# Patient Record
Sex: Female | Born: 1970 | Race: White | Hispanic: No | Marital: Married | Smoking: Never smoker
Health system: Southern US, Community
[De-identification: ages and names within clinical notes are randomized; demographics above are authoritative.]

## PROBLEM LIST (undated history)

## (undated) DIAGNOSIS — F419 Anxiety disorder, unspecified: Secondary | ICD-10-CM

## (undated) DIAGNOSIS — F32A Depression, unspecified: Secondary | ICD-10-CM

## (undated) DIAGNOSIS — I10 Essential (primary) hypertension: Secondary | ICD-10-CM

## (undated) DIAGNOSIS — F329 Major depressive disorder, single episode, unspecified: Secondary | ICD-10-CM

## (undated) DIAGNOSIS — E119 Type 2 diabetes mellitus without complications: Secondary | ICD-10-CM

## (undated) DIAGNOSIS — T7840XA Allergy, unspecified, initial encounter: Secondary | ICD-10-CM

## (undated) HISTORY — DX: Anxiety disorder, unspecified: F41.9

## (undated) HISTORY — DX: Depression, unspecified: F32.A

## (undated) HISTORY — DX: Allergy, unspecified, initial encounter: T78.40XA

## (undated) HISTORY — DX: Type 2 diabetes mellitus without complications: E11.9

## (undated) HISTORY — DX: Essential (primary) hypertension: I10

## (undated) HISTORY — DX: Major depressive disorder, single episode, unspecified: F32.9

---

## 2011-02-19 HISTORY — PX: HYSTEROSCOPY: SHX211

## 2015-03-07 ENCOUNTER — Ambulatory Visit: Payer: Self-pay | Admitting: Internal Medicine

## 2015-03-08 ENCOUNTER — Ambulatory Visit: Payer: Self-pay | Admitting: Family Medicine

## 2015-03-10 ENCOUNTER — Encounter: Payer: Self-pay | Admitting: Family Medicine

## 2015-03-10 ENCOUNTER — Ambulatory Visit (INDEPENDENT_AMBULATORY_CARE_PROVIDER_SITE_OTHER): Payer: 59 | Admitting: Family Medicine

## 2015-03-10 VITALS — BP 121/73 | HR 98 | Temp 97.9°F | Resp 20 | Ht 69.0 in | Wt 378.8 lb

## 2015-03-10 DIAGNOSIS — Z0000 Encounter for general adult medical examination without abnormal findings: Secondary | ICD-10-CM

## 2015-03-10 DIAGNOSIS — J302 Other seasonal allergic rhinitis: Secondary | ICD-10-CM

## 2015-03-10 DIAGNOSIS — M5386 Other specified dorsopathies, lumbar region: Secondary | ICD-10-CM | POA: Insufficient documentation

## 2015-03-10 DIAGNOSIS — E785 Hyperlipidemia, unspecified: Secondary | ICD-10-CM

## 2015-03-10 DIAGNOSIS — Z7189 Other specified counseling: Secondary | ICD-10-CM

## 2015-03-10 DIAGNOSIS — Z7689 Persons encountering health services in other specified circumstances: Secondary | ICD-10-CM | POA: Insufficient documentation

## 2015-03-10 DIAGNOSIS — M5431 Sciatica, right side: Secondary | ICD-10-CM | POA: Diagnosis not present

## 2015-03-10 DIAGNOSIS — F418 Other specified anxiety disorders: Secondary | ICD-10-CM | POA: Diagnosis not present

## 2015-03-10 DIAGNOSIS — I10 Essential (primary) hypertension: Secondary | ICD-10-CM | POA: Diagnosis not present

## 2015-03-10 DIAGNOSIS — E119 Type 2 diabetes mellitus without complications: Secondary | ICD-10-CM | POA: Insufficient documentation

## 2015-03-10 MED ORDER — CYCLOBENZAPRINE HCL 10 MG PO TABS: ORAL_TABLET | ORAL | Status: DC

## 2015-03-10 MED ORDER — MONTELUKAST SODIUM 10 MG PO TABS: ORAL_TABLET | ORAL | Status: DC

## 2015-03-10 MED ORDER — BENICAR HCT 40-25 MG PO TABS: 1.0000 | ORAL_TABLET | Freq: Every day | ORAL | Status: DC

## 2015-03-10 MED ORDER — GABAPENTIN 600 MG PO TABS: ORAL_TABLET | ORAL | Status: DC

## 2015-03-10 MED ORDER — ONGLYZA 5 MG PO TABS: 5.0000 mg | ORAL_TABLET | Freq: Every day | ORAL | Status: DC

## 2015-03-10 MED ORDER — AMLODIPINE BESYLATE 10 MG PO TABS: 10.0000 mg | ORAL_TABLET | Freq: Every day | ORAL | Status: DC

## 2015-03-10 MED ORDER — ATORVASTATIN CALCIUM 40 MG PO TABS: 40.0000 mg | ORAL_TABLET | Freq: Every day | ORAL | Status: DC

## 2015-03-10 MED ORDER — SERTRALINE HCL 100 MG PO TABS: 100.0000 mg | ORAL_TABLET | Freq: Every day | ORAL | Status: DC

## 2015-03-10 MED ORDER — BUSPIRONE HCL 15 MG PO TABS: 15.0000 mg | ORAL_TABLET | Freq: Every day | ORAL | Status: DC

## 2015-03-10 MED ORDER — ATENOLOL 50 MG PO TABS: 50.0000 mg | ORAL_TABLET | Freq: Every day | ORAL | Status: DC

## 2015-03-10 NOTE — Patient Instructions (Signed)
Sciatica Sciatica is pain, weakness, numbness, or tingling along the path of the sciatic nerve. The nerve starts in the lower back and runs down the back of each leg. The nerve controls the muscles in the lower leg and in the back of the knee, while also providing sensation to the back of the thigh, lower leg, and the sole of your foot. Sciatica is a symptom of another medical condition. For instance, nerve damage or certain conditions, such as a herniated disk or bone spur on the spine, pinch or put pressure on the sciatic nerve. This causes the pain, weakness, or other sensations normally associated with sciatica. Generally, sciatica only affects one side of the body. CAUSES   Herniated or slipped disc.  Degenerative disk disease.  A pain disorder involving the narrow muscle in the buttocks (piriformis syndrome).  Pelvic injury or fracture.  Pregnancy.  Tumor (rare). SYMPTOMS  Symptoms can vary from mild to very severe. The symptoms usually travel from the low back to the buttocks and down the back of the leg. Symptoms can include:  Mild tingling or dull aches in the lower back, leg, or hip.  Numbness in the back of the calf or sole of the foot.  Burning sensations in the lower back, leg, or hip.  Sharp pains in the lower back, leg, or hip.  Leg weakness.  Severe back pain inhibiting movement. These symptoms may get worse with coughing, sneezing, laughing, or prolonged sitting or standing. Also, being overweight may worsen symptoms. DIAGNOSIS  Your caregiver will perform a physical exam to look for common symptoms of sciatica. He or she may ask you to do certain movements or activities that would trigger sciatic nerve pain. Other tests may be performed to find the cause of the sciatica. These may include:  Blood tests.  X-rays.  Imaging tests, such as an MRI or CT scan. TREATMENT  Treatment is directed at the cause of the sciatic pain. Sometimes, treatment is not necessary  and the pain and discomfort goes away on its own. If treatment is needed, your caregiver may suggest:  Over-the-counter medicines to relieve pain.  Prescription medicines, such as anti-inflammatory medicine, muscle relaxants, or narcotics.  Applying heat or ice to the painful area.  Steroid injections to lessen pain, irritation, and inflammation around the nerve.  Reducing activity during periods of pain.  Exercising and stretching to strengthen your abdomen and improve flexibility of your spine. Your caregiver may suggest losing weight if the extra weight makes the back pain worse.  Physical therapy.  Surgery to eliminate what is pressing or pinching the nerve, such as a bone spur or part of a herniated disk. HOME CARE INSTRUCTIONS   Only take over-the-counter or prescription medicines for pain or discomfort as directed by your caregiver.  Apply ice to the affected area for 20 minutes, 3-4 times a day for the first 48-72 hours. Then try heat in the same way.  Exercise, stretch, or perform your usual activities if these do not aggravate your pain.  Attend physical therapy sessions as directed by your caregiver.  Keep all follow-up appointments as directed by your caregiver.  Do not wear high heels or shoes that do not provide proper support.  Check your mattress to see if it is too soft. A firm mattress may lessen your pain and discomfort. SEEK IMMEDIATE MEDICAL CARE IF:   You lose control of your bowel or bladder (incontinence).  You have increasing weakness in the lower back, pelvis, buttocks,   or legs.  You have redness or swelling of your back.  You have a burning sensation when you urinate.  You have pain that gets worse when you lie down or awakens you at night.  Your pain is worse than you have experienced in the past.  Your pain is lasting longer than 4 weeks.  You are suddenly losing weight without reason. MAKE SURE YOU:  Understand these  instructions.  Will watch your condition.  Will get help right away if you are not doing well or get worse.   This information is not intended to replace advice given to you by your health care provider. Make sure you discuss any questions you have with your health care provider.   Document Released: 01/29/2001 Document Revised: 10/26/2014 Document Reviewed: 06/16/2011 Elsevier Interactive Patient Education 2016 [employer].   Take Aleve two times a day for back pain, with food. Increased gabapentin to 300 mg morning, afternoon, then 600 mg at night.  Start stretches using pain as your guide.  FU in 2 weeks, at that time we will consider PT referral

## 2015-03-10 NOTE — Progress Notes (Addendum)
Subjective:    Patient ID: Emily Keller, female    DOB: Apr 09, 1970, 45 y.o.   MRN: [id number]  HPI   Patient presents for new patient establishment with multiple co-morbid condition and complaints of chronic back pain . All past medical history, surgical history, allergies, family history, immunizations and social history was obtained from the patient today and entered into the electronic medical record. Records are requested from her prior PCP, and will be reviewed at the time they are received. All medical records will be updated at that time.  Moved from [place] in July 2016, new to establish in the area. Pt has not found a dental home.   Diabetes, type 2: Patient reports she is a "borderline "diabetic, that was diagnosed about one year ago. She states she was started on metformin, and one is unable to tolerate it due to GI symptoms. She reports she then was prescribed Onglyza and has been on it since. Unknown last A1c, patient states she believes it was 6.1 and was collected in June. Patient denies any hyper/hypoglycemic events, nonhealing wounds or neuropathy of her extremities. Patient does not routinely exercise, she does not watch her diet, she does not routinely check her blood sugars. She states when she does they're usually between 90-110.  Hypertension: Patient states she was diagnosed about 2-1/2 years ago. At that time she also had an elevated heart rate, therefore her doctor started her on a regimen of atenolol, Benicar and Norvasc. Patient reports no negative symptoms. No chest pain, shortness of breath, lower extremity edema. Patient does not exercise routinely or follow a low-salt diet.  Anxiety: Patient states she's had a history of depression with anxiety, and uses BuSpar 50 mg daily at bedtime. She also is prescribed gabapentin, which she states is partially for her anxiety and partially for her back pain with sciatica. Patient reports she is doing well on her current  medications, and her anxiety/depression stable.  Back pain: She states she has had low back pain with right-sided sciatica for approximately 5 years. She states it is worse with standing and walking long distances. She reports the lower lumbar pain is constant, with radiation to right mid posterior thigh. She denies any injury or history of arthritis. 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 Patient is morbidly obese. Patient reports she has been on gabapentin for approximately 2 years, with only minimal relief. She denies any bowel or bladder incontinence. She has been tried on different NSAIDs, she states none of them seem to help so she stopped taking them. She does report compliance with her gabapentin 600 mg daily at bedtime.   Hyperlipidemia: She comes in with a history of hyperlipidemia, she currently is prescribed Lipitor 40 mg daily which she reports compliance, no negative side effects. She does not follow a special diet or exercise routinely. She has a history of hypertension and diabetes. There is a family history of heart disease in her maternal grandmother and her paternal grandmother. Patient is morbidly obese. Patient has elevated liver enzymes by collection to 2016.  Allergies: Patient states she has seasonal allergies, she takes Singulair daily at bedtime.  Health maintenance: Records requested Colonoscopy: Fhx at 29 in her father. Early screening suggested (45-47). Mammogram: "Not in a while" Cervical cancer screening: "PAP smear have not in a while"  Immunizations: Unknown Infectious disease screening: Unknown.   Labs from June 2016 (  outside provider): CMP: Glucose 145, BUN 12, creatinine 0.72, GFR 102, BUN/creatinine ratio 17, sodium 137, potassium 4.3, chloride 93, 227, calcium 9.4, protein 7.4, albumin 4.0, globulin 3.4, bilirubin 0.4, alkaline phosphatase 112, AST 46, ALT 53, CBC: WBC  13.4, RBC 4.6, hemoglobin 12.8, hematocrit 30.2, MCV 86, MCH 20.7, MCHC 33.4, RDW 14.7, platelets 386. Lipids: Total cholesterol 173, triglycerides 94, HDL 45, LDL 109  Past Medical History  Diagnosis Date  . Allergy   . Anxiety   . Depression   . Diabetes mellitus without complication (HCC)   . Hypertension    Allergies  Allergen Reactions  . Amoxicillin Diarrhea  . Metformin And Related Diarrhea   Past Surgical History  Procedure Laterality Date  . Hysteroscopy  2013   Family History  Problem Relation Age of Onset  . Diabetes Mother   . Arthritis Father   . Diabetes Father   . Colon cancer Father 36  . Heart disease Maternal Grandmother   . Pancreatic cancer Maternal Grandfather   . Hodgkin's lymphoma Paternal Grandmother   . Heart disease Paternal Grandfather    Social History   Social History  . Marital Status: Married    Spouse Name: N/A  . Number of Children: N/A  . Years of Education: N/A   Occupational History  . Not on file.   Social History Main Topics  . Smoking status: Never Smoker   . Smokeless tobacco: Never Used  . Alcohol Use: No  . Drug Use: No  . Sexual Activity: Yes    Birth Control/ Protection: None   Other Topics Concern  . Not on file   Social History Narrative   Married to [employer]. No children.    College educated. Surgical tech, no longer working.    Drinks caffeinated beverages.   Wears her seatbelt. Wears her bike helmet.    Smoke detector in the home.    Feels safe in her relationships.       Review of Systems Negative, with the exception of above mentioned in HPI     Objective:   Physical Exam BP 121/73 mmHg  Pulse 98  Temp(Src) 97.9 F (36.6 C) (Oral)  Resp 20  Ht  (1.753 m)  Wt 378 lb 12 oz (171.8 kg)  BMI 55.91 kg/m2  SpO2 95%  LMP 02/06/2015 (Approximate) Gen: Afebrile. No acute distress. Nontoxic in appearance, well-developed, well-nourished, morbidly obese Caucasian female. HENT: AT. [place]. Bilateral  TM visualized and normal in appearance. MMM. Bilateral nares without erythema or swelling. Throat without erythema or exudates.  Eyes:Pupils Equal Round Reactive to light, Extraocular movements intact,  Conjunctiva without redness, discharge or icterus. Neck/lymp/endocrine: Supple, no lymphadenopathy, no thyromegaly CV: RRR, no edema, +2/4 P posterior tibialis pulses Chest: CTAB, no wheeze or crackles Abd: Soft. Morbidly obese. NTND. BS present.  Skin: No rashes, purpura or petechiae.  Neuro:  Normal gait. PERLA. EOMi. Alert. Oriented x3. Cranial nerves II through XII intact. Muscle strength 5/5 upper and lower extremity.  Psych: Normal affect, dress and demeanor. Normal speech. Normal thought content and judgment.    Assessment & Plan:  Emily Keller is a 45 y.o. female for establishment of care with multiple co-morbid conditions and complains of back pain.  Depression with anxiety - Stable. Refills on BuSpar today. - Patient to follow up every 6 months on anxiety/depression.  Sciatica associated with disorder of lumbar spine, right - Worsening. Seems to be patient's main concern during her office visit today. Discussed with patient will  need to obtain records prior to treatment. She also need to return in 2 weeks to have dedicated visit to her back pain after received her records. - Increase gabapentin 300 mg morning and afternoon, 600 mg daily at bedtime - Patient encouraged to take Aleve twice a day when necessary with food - Patient given sciatica formation and instructions on stretches. Using pain as her guide. - She is morbidly obese, strongly encouraged loss of weight. - Discussed need a physical therapy to strengthen core muscles and back. - Follow-up 2-3 weeks  Essential hypertension, benign - Stable. Continue current regimen. Refills prescribed today. - Low salt diet. Dietary and exercise modifications suggested.  Seasonal allergies - Stable. Refills on  Singulair  Hyperlipidemia LDL goal <100 - Appear stable by last labs June 2016. Continue Lipitor 40 mg daily. - ? hepatic steatosis with elevated liver enzymes - Monitor yearly.  Type 2 diabetes mellitus without complication, without long-term current use of insulin (HCC) - Unknown last A1c, from patient's report it is stable. Continue Onglyza 5 mg daily. - Dietary and exercise modifications are suggested. - Consider nutrition referral. - Collection of A1c at next office visit.  Health maintenance: Records requested Colonoscopy: Fhx at 61 in her father. Early screening suggested (45-47). Mammogram: "Not in a while" Cervical cancer screening: "PAP smear have not in a while"  Immunizations: Unknown Infectious disease screening: Unknown.   Follow-up 2-3 weeks on low back pain and diabetes, A1c before rooming

## 2015-03-24 ENCOUNTER — Ambulatory Visit: Payer: 59 | Admitting: Family Medicine

## 2015-04-03 ENCOUNTER — Ambulatory Visit (INDEPENDENT_AMBULATORY_CARE_PROVIDER_SITE_OTHER): Payer: 59 | Admitting: Family Medicine

## 2015-04-03 ENCOUNTER — Encounter: Payer: Self-pay | Admitting: Family Medicine

## 2015-04-03 VITALS — BP 126/71 | HR 90 | Temp 97.9°F | Resp 20 | Wt 384.0 lb

## 2015-04-03 DIAGNOSIS — E119 Type 2 diabetes mellitus without complications: Secondary | ICD-10-CM

## 2015-04-03 DIAGNOSIS — M5431 Sciatica, right side: Secondary | ICD-10-CM | POA: Diagnosis not present

## 2015-04-03 LAB — POCT GLYCOSYLATED HEMOGLOBIN (HGB A1C): HEMOGLOBIN A1C: 6.5

## 2015-04-03 MED ORDER — CYCLOBENZAPRINE HCL 10 MG PO TABS: ORAL_TABLET | ORAL | Status: DC

## 2015-04-03 NOTE — Patient Instructions (Signed)
Back Pain, Adult °Back pain is very common in adults. The cause of back pain is rarely dangerous and the pain often gets better over time. The cause of your back pain may not be known. Some common causes of back pain include: °· Strain of the muscles or ligaments supporting the spine. °· Wear and tear (degeneration) of the spinal disks. °· Arthritis. °· Direct injury to the back. °For many people, back pain may return. Since back pain is rarely dangerous, most people can learn to manage this condition on their own. °HOME CARE INSTRUCTIONS °Watch your back pain for any changes. The following actions may help to lessen any discomfort you are feeling: °· Remain active. It is stressful on your back to sit or stand in one place for long periods of time. Do not sit, drive, or stand in one place for more than 30 minutes at a time. Take short walks on even surfaces as soon as you are able. Try to increase the length of time you walk each day. °· Exercise regularly as directed by your health care provider. Exercise helps your back heal faster. It also helps avoid future injury by keeping your muscles strong and flexible. °· Do not stay in bed. Resting more than 1-2 days can delay your recovery. °· Pay attention to your body when you bend and lift. The most comfortable positions are those that put less stress on your recovering back. Always use proper lifting techniques, including: °· Bending your knees. °· Keeping the load close to your body. °· Avoiding twisting. °· Find a comfortable position to sleep. Use a firm mattress and lie on your side with your knees slightly bent. If you lie on your back, put a pillow under your knees. °· Avoid feeling anxious or stressed. Stress increases muscle tension and can worsen back pain. It is important to recognize when you are anxious or stressed and learn ways to manage it, such as with exercise. °· Take medicines only as directed by your health care provider. Over-the-counter  medicines to reduce pain and inflammation are often the most helpful. Your health care provider may prescribe muscle relaxant drugs. These medicines help dull your pain so you can more quickly return to your normal activities and healthy exercise. °· Apply ice to the injured area: °· Put ice in a plastic bag. °· Place a towel between your skin and the bag. °· Leave the ice on for 20 minutes, 2-3 times a day for the first 2-3 days. After that, ice and heat may be alternated to reduce pain and spasms. °· Maintain a healthy weight. Excess weight puts extra stress on your back and makes it difficult to maintain good posture. °SEEK MEDICAL CARE IF: °· You have pain that is not relieved with rest or medicine. °· You have increasing pain going down into the legs or buttocks. °· You have pain that does not improve in one week. °· You have night pain. °· You lose weight. °· You have a fever or chills. °SEEK IMMEDIATE MEDICAL CARE IF:  °· You develop new bowel or bladder control problems. °· You have unusual weakness or numbness in your arms or legs. °· You develop nausea or vomiting. °· You develop abdominal pain. °· You feel faint. °  °This information is not intended to replace advice given to you by your health care provider. Make sure you discuss any questions you have with your health care provider. °  °Document Released: 02/04/2005 Document Revised: 02/25/2014 Document Reviewed: 06/08/2013 °Elsevier Interactive Patient Education ©2016 [employer]  Increase gabapentin to 600 mg, three times a day if can tolerate. If you can tolerate will call in at increased dose. Flexeril called in for you, if xray normal will place referral for PT.  I have also placed a referral for nutrition, for both diabetes and weight loss.  Weight loss will help improve your diabetes and back pain.

## 2015-04-03 NOTE — Progress Notes (Signed)
Patient ID: Emily Keller, female   DOB: 10/01/1970, 45 y.o.   MRN: [id number]   Subjective:    Patient ID: Emily Keller, female    DOB: 26-Jul-1970, 45 y.o.   MRN: [id number]  HPI   Diabetes, type 2: Patient reports she is a "borderline "diabetic, that was diagnosed about one year ago. She states she was started on metformin, and one is unable to tolerate it due to GI symptoms. She reports she then was prescribed Onglyza and has been on it since. Records have not been received by prior PCP. Patient denies any hyper/hypoglycemic events, nonhealing wounds or neuropathy of her extremities. Patient does not routinely exercise, she does not watch her diet, she does not routinely check her blood sugars. She states when she does they're usually between 90-110.  Back pain: patient states she broke her tailbone about 8 years ago and back pain is worse since then. She states her pain is in the lower lumbar and feels that it is going down both legs right>Left to mid thigh. She is attempting the increase in gabapentin, but has not reached increased dose discussed last appt. She states the pain  is worse with standing and walking long distances.  She denies any bowel or bladder incontinence. She has been tried on different NSAIDs, she states none of them seem to help so she stopped taking them. She has been using the muscle relaxer with some benefit, only using 1-3x  A week. She has never had imaging of her lower back or tried physical therapy.   Past Medical History  Diagnosis Date  . Allergy   . Anxiety   . Depression   . Diabetes mellitus without complication (HCC)   . Hypertension    Allergies  Allergen Reactions  . Amoxicillin Diarrhea  . Metformin And Related Diarrhea   Past Surgical History  Procedure Laterality Date  . Hysteroscopy  2013   Family History  Problem Relation Age of Onset  . Diabetes Mother   . Arthritis Father   . Diabetes Father   . Colon cancer Father 38  . Heart disease  Maternal Grandmother   . Pancreatic cancer Maternal Grandfather   . Hodgkin's lymphoma Paternal Grandmother   . Heart disease Paternal Grandfather    Social History   Social History  . Marital Status: Married    Spouse Name: N/A  . Number of Children: N/A  . Years of Education: N/A   Occupational History  . Not on file.   Social History Main Topics  . Smoking status: Never Smoker   . Smokeless tobacco: Never Used  . Alcohol Use: No  . Drug Use: No  . Sexual Activity: Yes    Birth Control/ Protection: None   Other Topics Concern  . Not on file   Social History Narrative   Married to [employer]. No children.    College educated. Surgical tech, no longer working.    Drinks caffeinated beverages.   Wears her seatbelt. Wears her bike helmet.    Smoke detector in the home.    Feels safe in her relationships.       Review of Systems Negative, with the exception of above mentioned in HPI     Objective:   Physical Exam BP 126/71 mmHg  Pulse 90  Temp(Src) 97.9 F (36.6 C)  Resp 20  Wt 384 lb (174.181 kg)  SpO2 95%  LMP 02/06/2015 (Approximate) Gen: Afebrile. No acute distress. Nontoxic in appearance, well-developed, well-nourished, morbidly obese Caucasian  female. HENT: AT. [place]. Bilateral TM visualized and normal in appearance. MMM. Bilateral nares without erythema or swelling. Throat without erythema or exudates.  Eyes:Pupils Equal Round Reactive to light, Extraocular movements intact,  Conjunctiva without redness, discharge or icterus. Neck/lymp/endocrine: Supple, no lymphadenopathy, no thyromegaly CV: RRR, no edema, +2/4 P posterior tibialis pulses Chest: CTAB, no wheeze or crackles Abd: Soft. Morbidly obese. NTND. BS present.  Skin: No rashes, purpura or petechiae.  MSK: No erythema, no soft tissue swelling. No TTP bony lumbar prominence.  No obvious step off.TTP left SI joint. FROM lumbar spine. Mild discomfort in extension only. Negative SLR and FABRE bilateral.  Neurovascularly intact distally. Body habitus limits  exam.  Neuro:  Normal gait. PERLA. EOMi. Alert. Oriented x3. Cranial nerves II through XII intact. Muscle strength 5/5 upper and lower extremity.  Psych: Normal affect, dress and demeanor. Normal speech. Normal thought content and judgment.    Assessment & Plan:  Emily Keller is a 45 y.o. female for back pain and diabetes follow up.  Sciatica associated with disorder of lumbar spine, right. - Increase gabapentin 600 mg TID - flexeril, refilled.x2  - She is morbidly obese, strongly encouraged loss of weight. - Lumbar/sacral  Xray ordered--> today, if normal send to PT. Discussed need a physical therapy to strengthen core muscles and back .- Patient given sciatica formation and instructions on stretches. Using pain as her guide. - F/U PRN, pr if worsening symptoms.   Type 2 diabetes mellitus without complication, without long-term current use of insulin (HCC) - 6.5 A1c, Continue Onglyza 5 mg daily. - Dietary and exercise modifications are suggested. -nutrition referral placed today    - 4 months/CPE with A1c prior to rooming.

## 2015-04-07 ENCOUNTER — Telehealth: Payer: Self-pay | Admitting: Family Medicine

## 2015-04-07 ENCOUNTER — Ambulatory Visit (HOSPITAL_BASED_OUTPATIENT_CLINIC_OR_DEPARTMENT_OTHER)
Admission: RE | Admit: 2015-04-07 | Discharge: 2015-04-07 | Disposition: A | Discharge status: Home / Self Care | Payer: 59 | Source: Ambulatory Visit | Attending: Family Medicine | Admitting: Family Medicine

## 2015-04-07 DIAGNOSIS — M47896 Other spondylosis, lumbar region: Secondary | ICD-10-CM | POA: Diagnosis not present

## 2015-04-07 DIAGNOSIS — M545 Low back pain: Secondary | ICD-10-CM | POA: Diagnosis not present

## 2015-04-07 DIAGNOSIS — M4316 Spondylolisthesis, lumbar region: Secondary | ICD-10-CM | POA: Diagnosis not present

## 2015-04-07 DIAGNOSIS — M5416 Radiculopathy, lumbar region: Secondary | ICD-10-CM | POA: Insufficient documentation

## 2015-04-07 DIAGNOSIS — M4310 Spondylolisthesis, site unspecified: Secondary | ICD-10-CM | POA: Insufficient documentation

## 2015-04-07 DIAGNOSIS — M5431 Sciatica, right side: Secondary | ICD-10-CM

## 2015-04-07 IMAGING — DX DG SACRUM/COCCYX 2+V
3 series · 3 of 3 positions shown · non-contrast
Comparison: None.

CLINICAL DATA: 45-year-old with chronic low back pain, now with
radicular pain in the lower extremities, right greater than left and
pain involving the coccyx.

EXAM:
SACRUM AND COCCYX - 2+ VIEW

[coccyx ap]
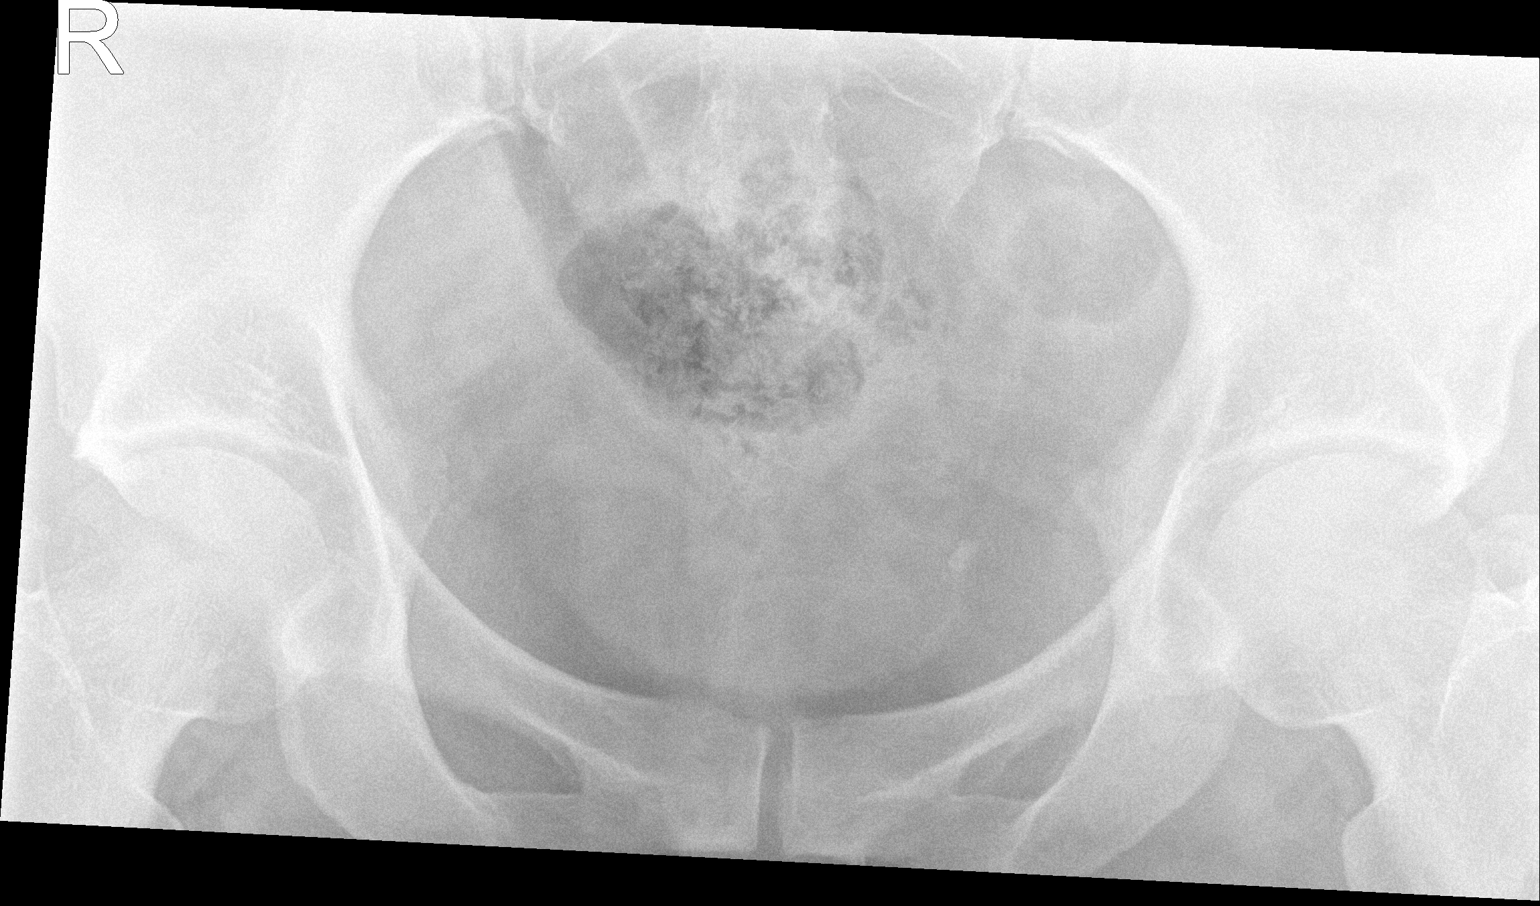

[sacrum ap]
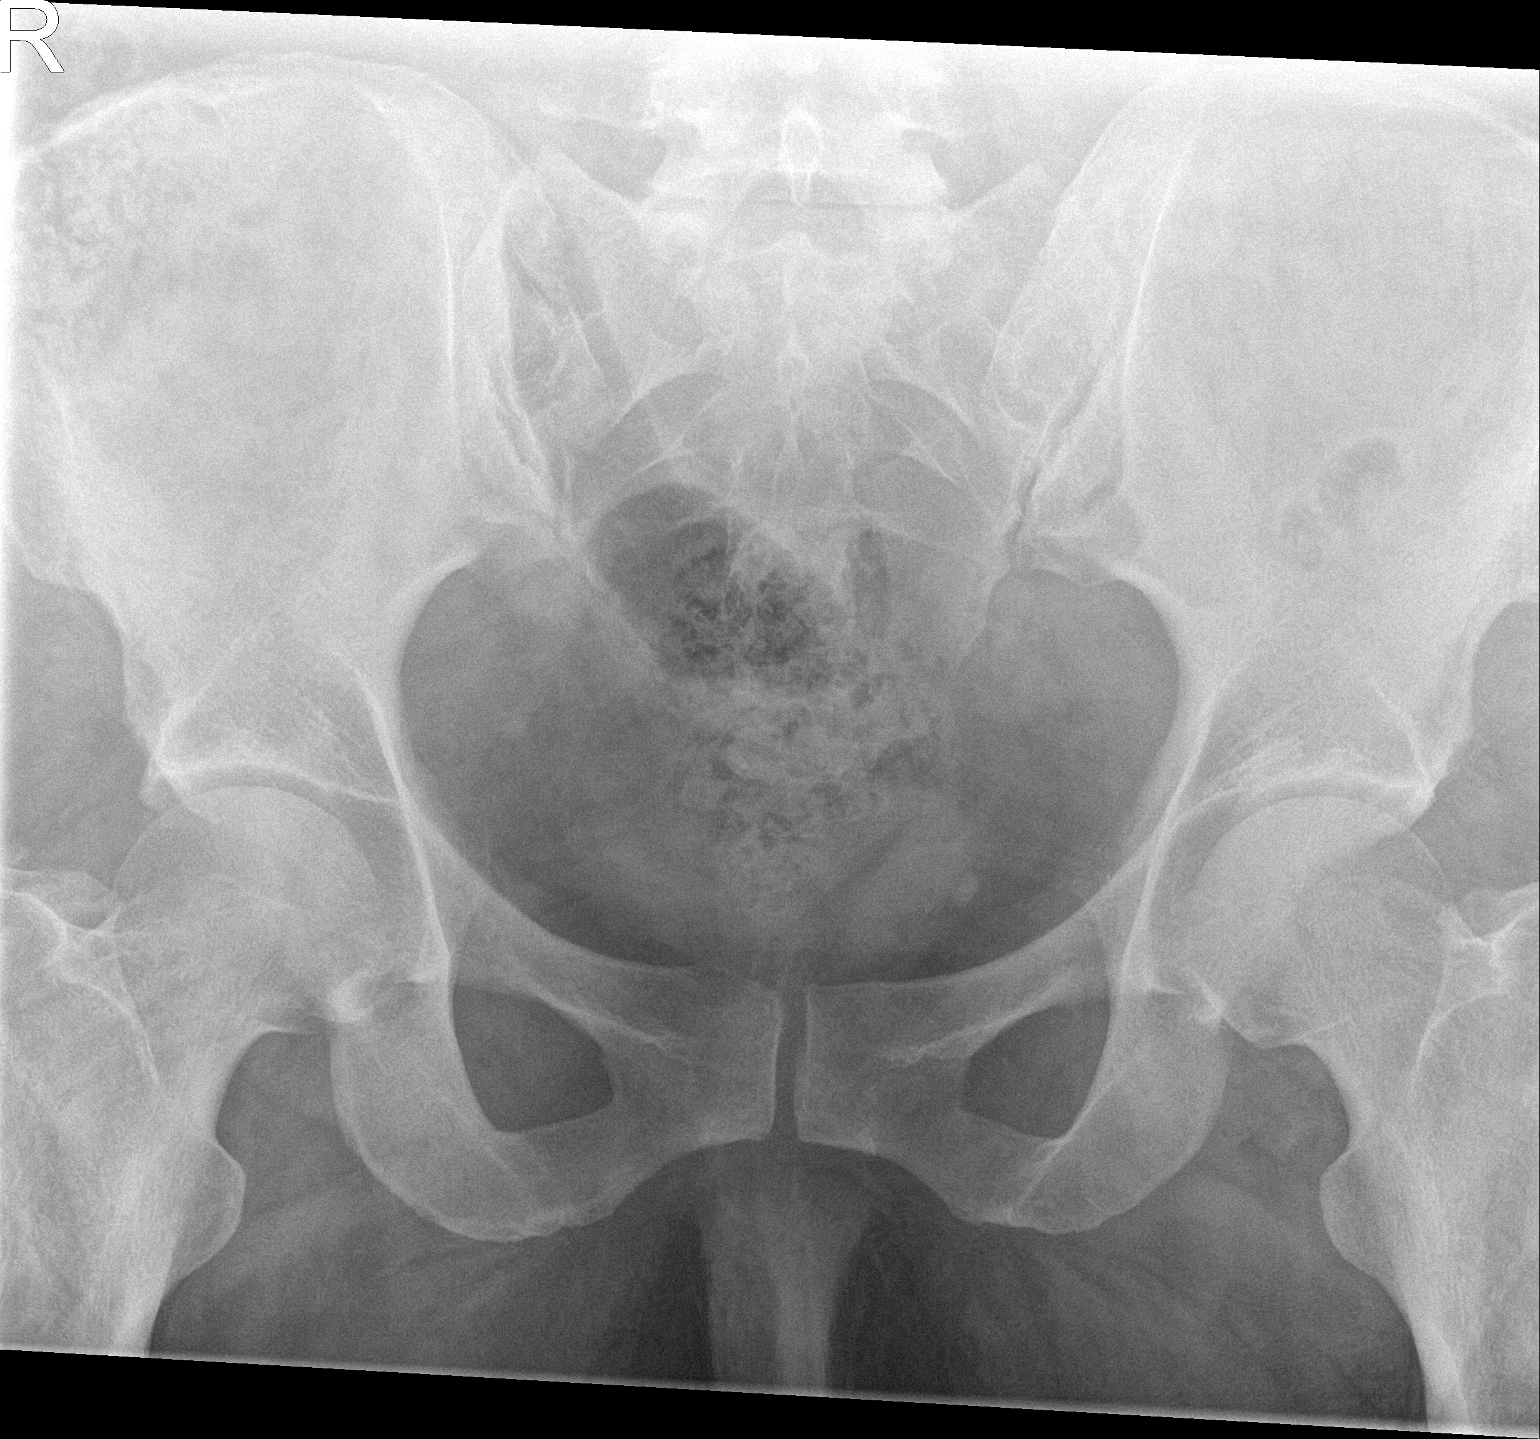

[sacrum lat]
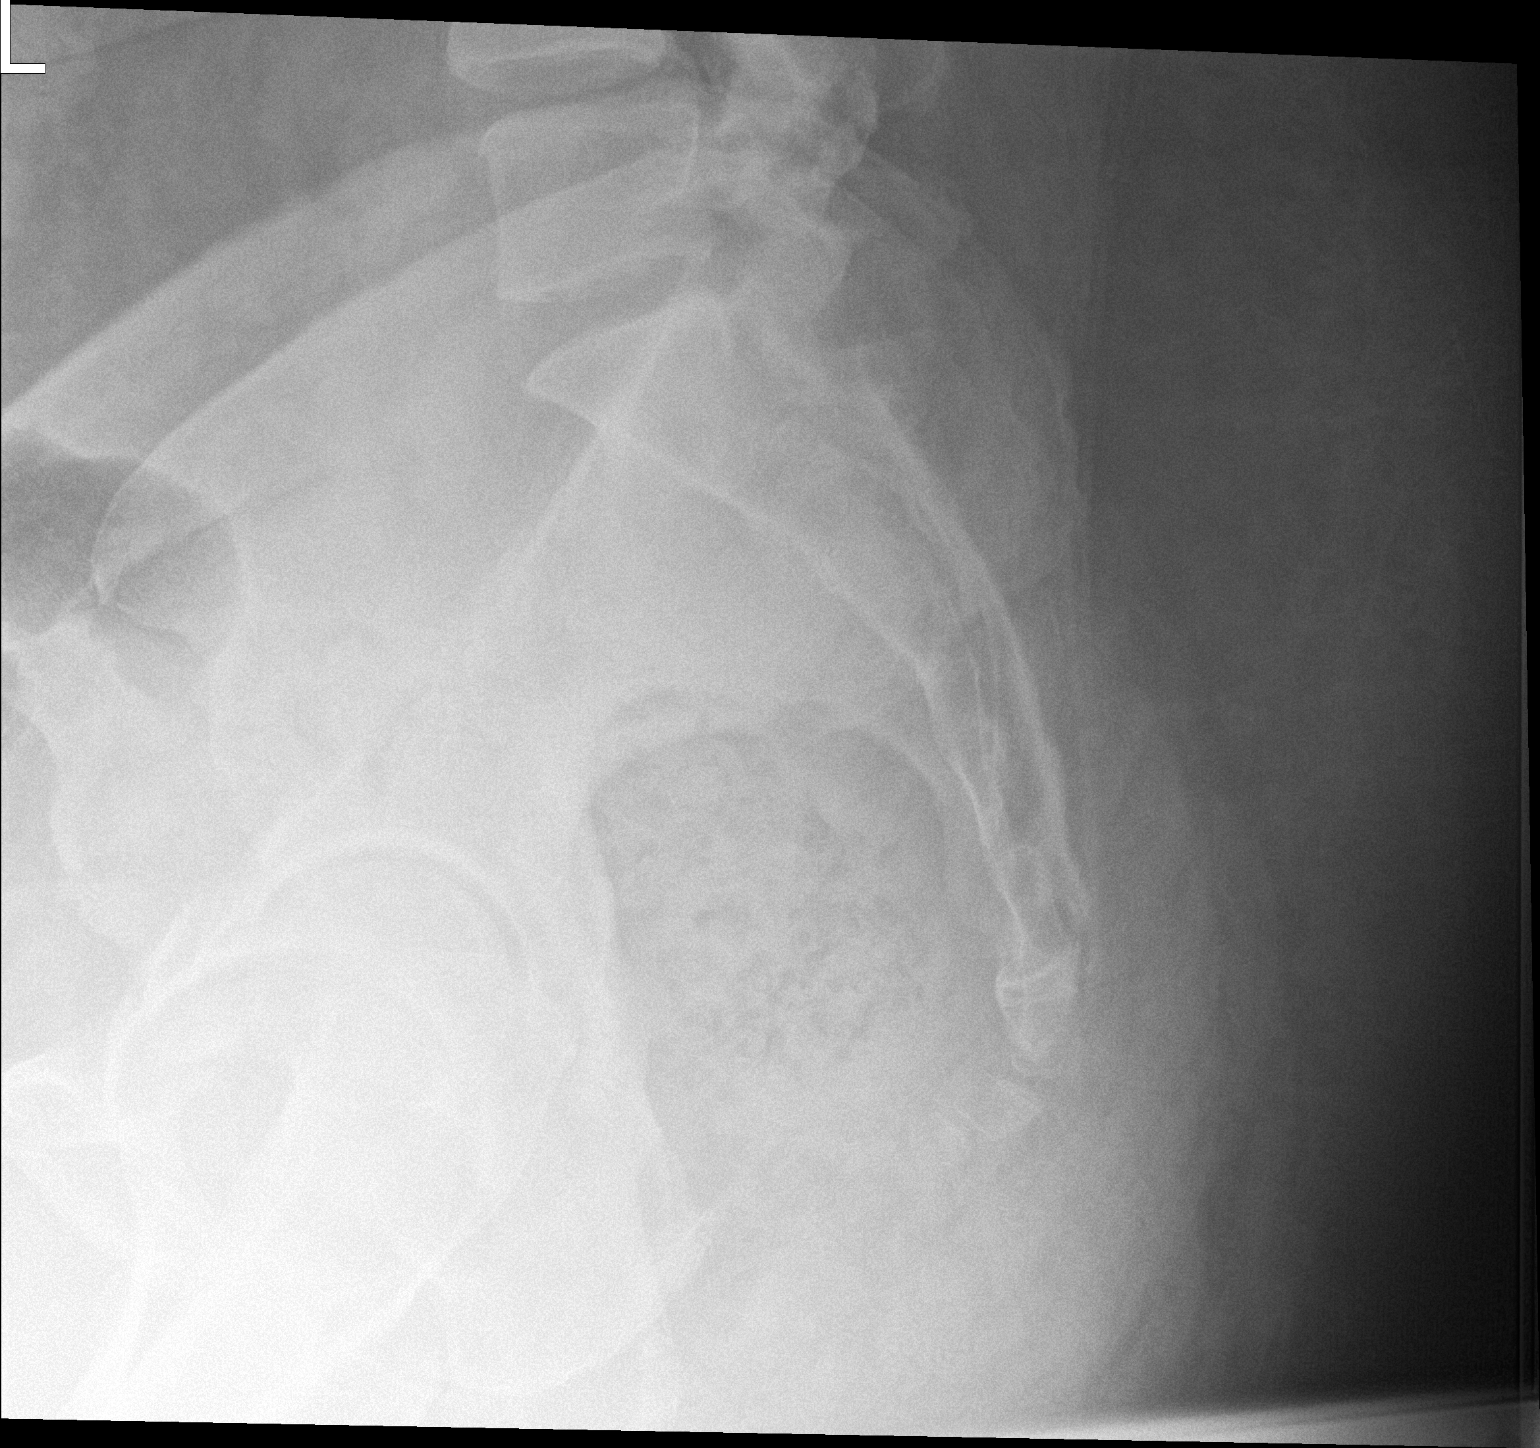

[3 of 3 positions shown; findings below may reference images not displayed]

FINDINGS: No evidence of acute or subacute fracture involving the sacrum or
coccyx. Sacroiliac joints and symphysis pubis intact..
IMPRESSION: Normal examination.

## 2015-04-07 NOTE — Telephone Encounter (Signed)
Please call pt: - Her xrays resulted with mild arthritis  like changes of the lower back and a small slippage of one vertebra on another. The slippage is called anterolisthesis and is mild in her case, but can be causing her symptoms. I will send her to a spine specialist to evaluate further. They can offer different type of treatments since she is having bilateral leg pain, and continue to monitor her condition.

## 2015-04-10 NOTE — Telephone Encounter (Signed)
Left message on patient voice mail with xray results and information regarding referral.

## 2015-04-28 ENCOUNTER — Other Ambulatory Visit: Payer: Self-pay | Admitting: Orthopedic Surgery

## 2015-04-28 DIAGNOSIS — M4316 Spondylolisthesis, lumbar region: Secondary | ICD-10-CM

## 2015-04-28 DIAGNOSIS — M4726 Other spondylosis with radiculopathy, lumbar region: Secondary | ICD-10-CM

## 2015-05-05 ENCOUNTER — Ambulatory Visit: Payer: 59 | Admitting: Dietician

## 2015-05-07 ENCOUNTER — Ambulatory Visit
Admission: RE | Admit: 2015-05-07 | Discharge: 2015-05-07 | Disposition: A | Discharge status: Home / Self Care | Payer: 59 | Source: Ambulatory Visit | Attending: Orthopedic Surgery | Admitting: Orthopedic Surgery

## 2015-05-07 DIAGNOSIS — M4726 Other spondylosis with radiculopathy, lumbar region: Secondary | ICD-10-CM

## 2015-05-07 DIAGNOSIS — M4316 Spondylolisthesis, lumbar region: Secondary | ICD-10-CM

## 2015-06-01 ENCOUNTER — Telehealth: Payer: Self-pay | Admitting: Family Medicine

## 2015-06-01 NOTE — Telephone Encounter (Signed)
Amlodipine Atenolol Benicar Lipitor Zoloft Buspirone [facility]nglyza [facility]

## 2015-06-05 MED ORDER — ATENOLOL 50 MG PO TABS: 50.0000 mg | ORAL_TABLET | Freq: Every day | ORAL | Status: DC

## 2015-06-05 MED ORDER — BUSPIRONE HCL 15 MG PO TABS: 15.0000 mg | ORAL_TABLET | Freq: Every day | ORAL | Status: DC

## 2015-06-05 MED ORDER — SERTRALINE HCL 100 MG PO TABS: 100.0000 mg | ORAL_TABLET | Freq: Every day | ORAL | Status: DC

## 2015-06-05 MED ORDER — ATORVASTATIN CALCIUM 40 MG PO TABS: 40.0000 mg | ORAL_TABLET | Freq: Every day | ORAL | Status: DC

## 2015-06-05 MED ORDER — BENICAR HCT 40-25 MG PO TABS: 1.0000 | ORAL_TABLET | Freq: Every day | ORAL | Status: DC

## 2015-06-05 MED ORDER — ONGLYZA 5 MG PO TABS: 5.0000 mg | ORAL_TABLET | Freq: Every day | ORAL | Status: DC

## 2015-06-05 MED ORDER — AMLODIPINE BESYLATE 10 MG PO TABS: 10.0000 mg | ORAL_TABLET | Freq: Every day | ORAL | Status: DC

## 2015-06-05 NOTE — Telephone Encounter (Signed)
meds refilled per refill protocol 

## 2015-06-07 ENCOUNTER — Other Ambulatory Visit: Payer: Self-pay | Admitting: Family Medicine

## 2015-08-01 ENCOUNTER — Encounter: Payer: Self-pay | Admitting: Family Medicine

## 2015-08-01 ENCOUNTER — Ambulatory Visit (INDEPENDENT_AMBULATORY_CARE_PROVIDER_SITE_OTHER): Payer: 59 | Admitting: Family Medicine

## 2015-08-01 VITALS — BP 137/69 | HR 85 | Temp 98.0°F | Resp 20 | Ht 69.0 in | Wt 396.2 lb

## 2015-08-01 DIAGNOSIS — E785 Hyperlipidemia, unspecified: Secondary | ICD-10-CM | POA: Diagnosis not present

## 2015-08-01 DIAGNOSIS — Z23 Encounter for immunization: Secondary | ICD-10-CM

## 2015-08-01 DIAGNOSIS — Z1231 Encounter for screening mammogram for malignant neoplasm of breast: Secondary | ICD-10-CM | POA: Diagnosis not present

## 2015-08-01 DIAGNOSIS — I10 Essential (primary) hypertension: Secondary | ICD-10-CM

## 2015-08-01 DIAGNOSIS — Z6841 Body Mass Index (BMI) 40.0 and over, adult: Secondary | ICD-10-CM | POA: Diagnosis not present

## 2015-08-01 DIAGNOSIS — R748 Abnormal levels of other serum enzymes: Secondary | ICD-10-CM | POA: Diagnosis not present

## 2015-08-01 DIAGNOSIS — E1140 Type 2 diabetes mellitus with diabetic neuropathy, unspecified: Secondary | ICD-10-CM

## 2015-08-01 DIAGNOSIS — Z0001 Encounter for general adult medical examination with abnormal findings: Secondary | ICD-10-CM | POA: Diagnosis not present

## 2015-08-01 DIAGNOSIS — H6122 Impacted cerumen, left ear: Secondary | ICD-10-CM

## 2015-08-01 DIAGNOSIS — Z0000 Encounter for general adult medical examination without abnormal findings: Secondary | ICD-10-CM

## 2015-08-01 DIAGNOSIS — J302 Other seasonal allergic rhinitis: Secondary | ICD-10-CM

## 2015-08-01 DIAGNOSIS — H6120 Impacted cerumen, unspecified ear: Secondary | ICD-10-CM | POA: Insufficient documentation

## 2015-08-01 LAB — LIPID PANEL
CHOL/HDL RATIO: 3
Cholesterol: 172 mg/dL (ref 0–200)
HDL: 53.4 mg/dL (ref >39.00)
LDL Cholesterol: 100 mg/dL — ABNORMAL HIGH (ref 0–99)
NONHDL: 118.52
Triglycerides: 93 mg/dL (ref 0.0–149.0)
VLDL: 18.6 mg/dL (ref 0.0–40.0)

## 2015-08-01 LAB — CBC WITH DIFFERENTIAL/PLATELET
BASOS PCT: 0.4 % (ref 0.0–3.0)
Basophils Absolute: 0.1 10*3/uL (ref 0.0–0.1)
EOS PCT: 2.8 % (ref 0.0–5.0)
Eosinophils Absolute: 0.4 10*3/uL (ref 0.0–0.7)
HCT: 38.4 % (ref 36.0–46.0)
Hemoglobin: 12.4 g/dL (ref 12.0–15.0)
LYMPHS ABS: 2 10*3/uL (ref 0.7–4.0)
Lymphocytes Relative: 14.5 % (ref 12.0–46.0)
MCHC: 32.1 g/dL (ref 30.0–36.0)
MCV: 85.3 fl (ref 78.0–100.0)
MONO ABS: 0.5 10*3/uL (ref 0.1–1.0)
Monocytes Relative: 4 % (ref 3.0–12.0)
NEUTROS ABS: 10.8 10*3/uL — AB (ref 1.4–7.7)
NEUTROS PCT: 78.3 % — AB (ref 43.0–77.0)
PLATELETS: 366 10*3/uL (ref 150.0–400.0)
RBC: 4.5 Mil/uL (ref 3.87–5.11)
RDW: 16.3 % — AB (ref 11.5–15.5)
WBC: 13.8 10*3/uL — ABNORMAL HIGH (ref 4.0–10.5)

## 2015-08-01 LAB — COMPREHENSIVE METABOLIC PANEL
ALT: 29 U/L (ref 0–35)
AST: 22 U/L (ref 0–37)
Albumin: 3.9 g/dL (ref 3.5–5.2)
Alkaline Phosphatase: 94 U/L (ref 39–117)
BUN: 12 mg/dL (ref 6–23)
CHLORIDE: 97 meq/L (ref 96–112)
CO2: 30 meq/L (ref 19–32)
Calcium: 9.6 mg/dL (ref 8.4–10.5)
Creatinine, Ser: 0.75 mg/dL (ref 0.40–1.20)
GFR: 88.64 mL/min (ref >60.00)
GLUCOSE: 147 mg/dL — AB (ref 70–99)
POTASSIUM: 4.6 meq/L (ref 3.5–5.1)
SODIUM: 137 meq/L (ref 135–145)
Total Bilirubin: 0.5 mg/dL (ref 0.2–1.2)
Total Protein: 7.2 g/dL (ref 6.0–8.3)

## 2015-08-01 LAB — TSH: TSH: 3.9 u[IU]/mL (ref 0.35–4.50)

## 2015-08-01 LAB — HEMOGLOBIN A1C: HEMOGLOBIN A1C: 7 % — AB (ref 4.6–6.5)

## 2015-08-01 MED ORDER — CARBAMIDE PEROXIDE 6.5 % OT SOLN: 5.0000 [drp] | Freq: Two times a day (BID) | OTIC | Status: DC

## 2015-08-01 MED ORDER — OLMESARTAN MEDOXOMIL 40 MG PO TABS: 40.0000 mg | ORAL_TABLET | Freq: Every day | ORAL | Status: DC

## 2015-08-01 MED ORDER — HYDROCHLOROTHIAZIDE 50 MG PO TABS: 50.0000 mg | ORAL_TABLET | Freq: Every day | ORAL | Status: DC

## 2015-08-01 MED ORDER — OLMESARTAN MEDOXOMIL 20 MG PO TABS: 40.0000 mg | ORAL_TABLET | Freq: Every day | ORAL | Status: DC

## 2015-08-01 NOTE — Addendum Note (Signed)
Addended by: Thomasena EdisWILLIAMS, Alia Parsley N on: 08/01/2015 01:54 PM   Modules accepted: Orders, SmartSet

## 2015-08-01 NOTE — Progress Notes (Signed)
Patient ID: Emily Keller, female   DOB: March 07, 1970, 45 y.o.   MRN: [id number]      Patient ID: Emily Keller, female  DOB: Jun 29, 1970, 45 y.o.   MRN: [id number]  Subjective:  Emily Keller is a 45 y.o. female present for CPE All past medical history, surgical history, allergies, family history, immunizations, medications and social history were updated in the electronic medical record today. All recent labs, ED visits and hospitalizations within the last year were reviewed.  Patient Care Team    Relationship Specialty Notifications Start End  Natalia Leatherwood, DO PCP - General Family Medicine  03/10/15   Letta Kocher, MD  Rehabilitation  08/01/15    Comment: Neurology/spine    Health maintenance: Records requested, never received Colonoscopy: Fhx at 50 in her father. Early screening suggested (48). Mammogram: No fhx of breast cancer. [facility] Cervical cancer screening: "PAP smear have not in a while" she is going to look for GYN or PAP here next year. Immunizations: tdap 2011. Flu yearly. PNA series indicated with DM.  Infectious disease screening: HIV declined.  Assistive device: None  Oxygen use: None Patient has not found a Dental home sine moving  Hospitalizations/ED visits: None   Depression screen [facility] 2/9 08/01/2015 03/10/2015  Decreased Interest 0 0  Down, Depressed, Hopeless 0 0  PHQ - 2 Score 0 0    Fall Risk  08/01/2015 03/10/2015  Falls in the past year? No No   Current Exercise Habits: Structured exercise class, Type of exercise: Other - see comments (PT), Time (Minutes): 30, Frequency (Times/Week): 2, Weekly Exercise (Minutes/Week): 60, Intensity: Mild      Past Medical History  Diagnosis Date  . Allergy   . Anxiety   . Depression   . Diabetes mellitus without complication (HCC)   . Hypertension    Allergies  Allergen Reactions  . Amoxicillin Diarrhea  . Metformin And Related Diarrhea   Past Surgical History  Procedure Laterality Date  .  Hysteroscopy  2013   Family History  Problem Relation Age of Onset  . Diabetes Mother   . Arthritis Father   . Diabetes Father   . Colon cancer Father 30  . Heart disease Maternal Grandmother   . Pancreatic cancer Maternal Grandfather   . Hodgkin's lymphoma Paternal Grandmother   . Heart disease Paternal Grandfather    Social History   Social History  . Marital Status: Married    Spouse Name: N/A  . Number of Children: N/A  . Years of Education: N/A   Occupational History  . Not on file.   Social History Main Topics  . Smoking status: Never Smoker   . Smokeless tobacco: Never Used  . Alcohol Use: No  . Drug Use: No  . Sexual Activity: Yes    Birth Control/ Protection: None   Other Topics Concern  . Not on file   Social History Narrative   Married to [employer]. No children.    College educated. Surgical tech, no longer working.    Drinks caffeinated beverages.   Wears her seatbelt. Wears her bike helmet.    Smoke detector in the home.    Feels safe in her relationships.         ROS: Negative, with the exception of above mentioned in HPI  Objective: BP 137/69 mmHg  Pulse 85  Temp(Src) 98 F (36.7 C) (Oral)  Resp 20  Ht 5\' 9"  (1.753 m)  Wt 396 lb 4 oz (179.738 kg)  BMI 58.49 kg/m2  SpO2 95%  LMP 07/17/2015 Gen: Afebrile. No acute distress. Nontoxic in appearance, well-developed, well-nourished, female, morbidly obese female. Pleasant.  HENT: AT. Superior. Bilateral TM visualized, with left cerumen impaction, right air fluid level, no erythema or bulging.  normal external auditory canal. MMM, no oral lesions, adequate dentition. Bilateral nares mild swelling, no erythema. Throat without erythema, ulcerations or exudates. No Cough on exam, No hoarseness on exam. Eyes:Pupils Equal Round Reactive to light, Extraocular movements intact,  Conjunctiva without redness, discharge or icterus. Neck/lymp/endocrine: Supple/thick, no lymphadenopathy, no thyromegaly CV: RRR no  murmur appreciated, No edema, diminished/equal bilaterally DP pulse. Chest: CTAB, no wheeze, rhonchi or crackles. Normal Respiratory effort. good Air movement. Abd: Soft. Morbid obesity. NTND. BS present. no Masses palpated. No hepatosplenomegaly. No rebound tenderness or guarding. Skin: no rashes, purpura or petechiae. Warm and well-perfused. Skin intact. Neuro/Msk:  Normal gait. PERLA. EOMi. Alert. Oriented x3.  Cranial nerves II through XII intact. Muscle strength 5/5 UE/LE. DTRs equal bilaterally. Psych: Normal affect, dress and demeanor. Normal speech. Normal thought content and judgment. Diabetic Foot Exam - Simple   Simple Foot Form  Diabetic Foot exam was performed with the following findings:  Yes 08/01/2015  9:37 AM  Visual Inspection  No deformities, no ulcerations, no other skin breakdown bilaterally:  Yes  Sensation Testing  Intact to touch and monofilament testing bilaterally:  Yes  Pulse Check  See comments:  Yes  Comments  Long nails. Skin intact, diminished but equal DP pulses.         Assessment/plan: Emily Keller is a 45 y.o. female present for annual exam. Encounter for preventive health examination with abnormal findings/BMI<50, Morbid obesity (HCC) - Cbc, cmp, a1c, lipid panel, tsh - pt to call and reschedule nutrition appt. - pt to find dental home and GYN (or can have PAP with CPE next year-her choice) - Consider early colonoscopy screening. - Colonoscopy: Fhx at [id number] in her father. Early screening suggested (48). - Mammogram: No fhx of breast cancer. [facility] - Cervical cancer screening: "PAP smear have not in a while" she is going to look for GYN or PAP here next year. - Immunizations: tdap 2011. Flu yearly. PNA series indicated with DM--> Prevnar today - Infectious disease screening: HIV declined.  - Patient was encouraged to exercise greater than 150 minutes a week. Patient was encouraged to choose a diet filled with fresh fruits and  vegetables, and lean meats. AVS provided to patient today for education/recommendation on gender specific health and safety maintenance.  Type 2 diabetes mellitus with diabetic neuropathy, without long-term current use of insulin (HCC) - Continue onglyza 5 mg; normal fasting glucose report - HgB A1c - Lipid panel - foot exam completed - eye exam UTD - PNA series started  Encounter for screening mammogram for breast cancer - MM DIGITAL SCREENING BILATERAL; Future   Elevated liver enzymes - Comprehensive metabolic panel  Hyperlipidemia LDL goal <100 - TSH - Lipid panel - Continue Lipitor 40 mg  Essential hypertension, benign - CBC w/Diff - Comprehensive metabolic panel - TSH - Lipid panel - Benicar/hctz no longer covered by insurance. Will need to break into 3 pills in order to be on formulary.  - olmesartan (BENICAR) 20 MG tablet; Take 2 tablets (40 mg total) by mouth daily.  Dispense: 60 tablet; Refill: 5 - hydrochlorothiazide (HYDRODIURIL) 50 MG tablet; Take 1 tablet (50 mg total) by mouth daily.  Dispense: 30 tablet; Refill: 5  Seasonal allergies - Continue  singular. Start flonase and consider adding allegra/antihistamine.   Cerumen impaction, left - carbamide peroxide (DEBROX) 6.5 % otic solution; Place 5 drops into the left ear 2 (two) times daily.  Dispense: 15 mL; Refill: 0     Return in about 3 months (around 11/01/2015) for DM/ HTN.  Electronically signed by: Felix Pacini, DO [facility] [place]

## 2015-08-01 NOTE — Patient Instructions (Signed)
Lab today  Call about nutrition appt. mammogram ordered Prevnar today Get a dental home and  gynecologist   Health Maintenance, Female Adopting a healthy lifestyle and getting preventive care can go a long way to promote health and wellness. Talk with your health care provider about what schedule of regular examinations is right for you. This is a good chance for you to check in with your provider about disease prevention and staying healthy. In between checkups, there are plenty of things you can do on your own. Experts have done a lot of research about which lifestyle changes and preventive measures are most likely to keep you healthy. Ask your health care provider for more information. WEIGHT AND DIET  Eat a healthy diet  Be sure to include plenty of vegetables, fruits, low-fat dairy products, and lean protein.  Do not eat a lot of foods high in solid fats, added sugars, or salt.  Get regular exercise. This is one of the most important things you can do for your health.  Most adults should exercise for at least 150 minutes each week. The exercise should increase your heart rate and make you sweat (moderate-intensity exercise).  Most adults should also do strengthening exercises at least twice a week. This is in addition to the moderate-intensity exercise.  Maintain a healthy weight  Body mass index (BMI) is a measurement that can be used to identify possible weight problems. It estimates body fat based on height and weight. Your health care provider can help determine your BMI and help you achieve or maintain a healthy weight.  For females 60 years of age and older:   A BMI below 18.5 is considered underweight.  A BMI of 18.5 to 24.9 is normal.  A BMI of 25 to 29.9 is considered overweight.  A BMI of 30 and above is considered obese.  Watch levels of cholesterol and blood lipids  You should start having your blood tested for lipids and cholesterol at 45 years of age, then  have this test every 5 years.  You may need to have your cholesterol levels checked more often if:  Your lipid or cholesterol levels are high.  You are older than 45 years of age.  You are at high risk for heart disease.  CANCER SCREENING   Lung Cancer  Lung cancer screening is recommended for adults 34-110 years old who are at high risk for lung cancer because of a history of smoking.  A yearly low-dose CT scan of the lungs is recommended for people who:  Currently smoke.  Have quit within the past 15 years.  Have at least a 30-pack-year history of smoking. A pack year is smoking an average of one pack of cigarettes a day for 1 year.  Yearly screening should continue until it has been 15 years since you quit.  Yearly screening should stop if you develop a health problem that would prevent you from having lung cancer treatment.  Breast Cancer  Practice breast self-awareness. This means understanding how your breasts normally appear and feel.  It also means doing regular breast self-exams. Let your health care provider know about any changes, no matter how small.  If you are in your 20s or 30s, you should have a clinical breast exam (CBE) by a health care provider every 1-3 years as part of a regular health exam.  If you are 1 or older, have a CBE every year. Also consider having a breast X-ray (mammogram) every year.  If  you have a family history of breast cancer, talk to your health care provider about genetic screening.  If you are at high risk for breast cancer, talk to your health care provider about having an MRI and a mammogram every year.  Breast cancer gene (BRCA) assessment is recommended for women who have family members with BRCA-related cancers. BRCA-related cancers include:  Breast.  Ovarian.  Tubal.  Peritoneal cancers.  Results of the assessment will determine the need for genetic counseling and BRCA1 and BRCA2 testing. Cervical Cancer Your health  care provider may recommend that you be screened regularly for cancer of the pelvic organs (ovaries, uterus, and vagina). This screening involves a pelvic examination, including checking for microscopic changes to the surface of your cervix (Pap test). You may be encouraged to have this screening done every 3 years, beginning at age 7.  For women ages 64-65, health care providers may recommend pelvic exams and Pap testing every 3 years, or they may recommend the Pap and pelvic exam, combined with testing for human papilloma virus (HPV), every 5 years. Some types of HPV increase your risk of cervical cancer. Testing for HPV may also be done on women of any age with unclear Pap test results.  Other health care providers may not recommend any screening for nonpregnant women who are considered low risk for pelvic cancer and who do not have symptoms. Ask your health care provider if a screening pelvic exam is right for you.  If you have had past treatment for cervical cancer or a condition that could lead to cancer, you need Pap tests and screening for cancer for at least 20 years after your treatment. If Pap tests have been discontinued, your risk factors (such as having a new sexual partner) need to be reassessed to determine if screening should resume. Some women have medical problems that increase the chance of getting cervical cancer. In these cases, your health care provider may recommend more frequent screening and Pap tests. Colorectal Cancer  This type of cancer can be detected and often prevented.  Routine colorectal cancer screening usually begins at 45 years of age and continues through 45 years of age.  Your health care provider may recommend screening at an earlier age if you have risk factors for colon cancer.  Your health care provider may also recommend using home test kits to check for hidden blood in the stool.  A small camera at the end of a tube can be used to examine your colon  directly (sigmoidoscopy or colonoscopy). This is done to check for the earliest forms of colorectal cancer.  Routine screening usually begins at age 22.  Direct examination of the colon should be repeated every 5-10 years through 45 years of age. However, you may need to be screened more often if early forms of precancerous polyps or small growths are found. Skin Cancer  Check your skin from head to toe regularly.  Tell your health care provider about any new moles or changes in moles, especially if there is a change in a mole's shape or color.  Also tell your health care provider if you have a mole that is larger than the size of a pencil eraser.  Always use sunscreen. Apply sunscreen liberally and repeatedly throughout the day.  Protect yourself by wearing long sleeves, pants, a wide-brimmed hat, and sunglasses whenever you are outside. HEART DISEASE, DIABETES, AND HIGH BLOOD PRESSURE   High blood pressure causes heart disease and increases the risk of  stroke. High blood pressure is more likely to develop in:  People who have blood pressure in the high end of the normal range (130-139/85-89 mm Hg).  People who are overweight or obese.  People who are African American.  If you are 69-94 years of age, have your blood pressure checked every 3-5 years. If you are 76 years of age or older, have your blood pressure checked every year. You should have your blood pressure measured twice--once when you are at a hospital or clinic, and once when you are not at a hospital or clinic. Record the average of the two measurements. To check your blood pressure when you are not at a hospital or clinic, you can use:  An automated blood pressure machine at a pharmacy.  A home blood pressure monitor.  If you are between 80 years and 3 years old, ask your health care provider if you should take aspirin to prevent strokes.  Have regular diabetes screenings. This involves taking a blood sample to check  your fasting blood sugar level.  If you are at a normal weight and have a low risk for diabetes, have this test once every three years after 45 years of age.  If you are overweight and have a high risk for diabetes, consider being tested at a younger age or more often. PREVENTING INFECTION  Hepatitis B  If you have a higher risk for hepatitis B, you should be screened for this virus. You are considered at high risk for hepatitis B if:  You were born in a country where hepatitis B is common. Ask your health care provider which countries are considered high risk.  Your parents were born in a high-risk country, and you have not been immunized against hepatitis B (hepatitis B vaccine).  You have HIV or AIDS.  You use needles to inject street drugs.  You live with someone who has hepatitis B.  You have had sex with someone who has hepatitis B.  You get hemodialysis treatment.  You take certain medicines for conditions, including cancer, organ transplantation, and autoimmune conditions. Hepatitis C  Blood testing is recommended for:  Everyone born from [birth year] through 1965.  Anyone with known risk factors for hepatitis C. Sexually transmitted infections (STIs)  You should be screened for sexually transmitted infections (STIs) including gonorrhea and chlamydia if:  You are sexually active and are younger than 45 years of age.  You are older than 45 years of age and your health care provider tells you that you are at risk for this type of infection.  Your sexual activity has changed since you were last screened and you are at an increased risk for chlamydia or gonorrhea. Ask your health care provider if you are at risk.  If you do not have HIV, but are at risk, it may be recommended that you take a prescription medicine daily to prevent HIV infection. This is called pre-exposure prophylaxis (PrEP). You are considered at risk if:  You are sexually active and do not regularly use  condoms or know the HIV status of your partner(s).  You take drugs by injection.  You are sexually active with a partner who has HIV. Talk with your health care provider about whether you are at high risk of being infected with HIV. If you choose to begin PrEP, you should first be tested for HIV. You should then be tested every 3 months for as long as you are taking PrEP.  PREGNANCY   If  you are premenopausal and you may become pregnant, ask your health care provider about preconception counseling.  If you may become pregnant, take 400 to 800 micrograms (mcg) of folic acid every day.  If you want to prevent pregnancy, talk to your health care provider about birth control (contraception). OSTEOPOROSIS AND MENOPAUSE   Osteoporosis is a disease in which the bones lose minerals and strength with aging. This can result in serious bone fractures. Your risk for osteoporosis can be identified using a bone density scan.  If you are 43 years of age or older, or if you are at risk for osteoporosis and fractures, ask your health care provider if you should be screened.  Ask your health care provider whether you should take a calcium or vitamin D supplement to lower your risk for osteoporosis.  Menopause may have certain physical symptoms and risks.  Hormone replacement therapy may reduce some of these symptoms and risks. Talk to your health care provider about whether hormone replacement therapy is right for you.  HOME CARE INSTRUCTIONS   Schedule regular health, dental, and eye exams.  Stay current with your immunizations.   Do not use any tobacco products including cigarettes, chewing tobacco, or electronic cigarettes.  If you are pregnant, do not drink alcohol.  If you are breastfeeding, limit how much and how often you drink alcohol.  Limit alcohol intake to no more than 1 drink per day for nonpregnant women. One drink equals 12 ounces of beer, 5 ounces of wine, or 1 ounces of hard  liquor.  Do not use street drugs.  Do not share needles.  Ask your health care provider for help if you need support or information about quitting drugs.  Tell your health care provider if you often feel depressed.  Tell your health care provider if you have ever been abused or do not feel safe at home.   This information is not intended to replace advice given to you by your health care provider. Make sure you discuss any questions you have with your health care provider.   Document Released: 08/20/2010 Document Revised: 02/25/2014 Document Reviewed: 01/06/2013 Elsevier Interactive Patient Education [employer].

## 2015-08-02 ENCOUNTER — Telehealth: Payer: Self-pay | Admitting: Family Medicine

## 2015-08-02 NOTE — Telephone Encounter (Signed)
Left message for patient to return call.

## 2015-08-02 NOTE — Telephone Encounter (Signed)
Spoke with patient reviewed lab results and instructions. Patient has scheduled with Nutrition . Patient verbalized understanding.

## 2015-08-02 NOTE — Telephone Encounter (Signed)
Please call pt: - her cholesterol looks wonderful. - her a1c is elevated from prior. It is 7, ideally I would want tighter control. She was 6.5 prior. I would like to give her 3 months to improve with > 150 min of exercise and better diet control. She had a nutrition referral placed, I would encouraged to make that appt if she has not done so already. We will need to follow up 3 months repeat a1c (in office before rooming). If not able to lower will need to talk about adding medication if higher. - all other labs normal and her liver enzymes are normal this time.

## 2015-08-31 ENCOUNTER — Encounter: Payer: Self-pay | Admitting: Skilled Nursing Facility1

## 2015-08-31 ENCOUNTER — Encounter: Payer: 59 | Attending: Family Medicine | Admitting: Skilled Nursing Facility1

## 2015-08-31 VITALS — Ht 69.0 in

## 2015-08-31 DIAGNOSIS — E119 Type 2 diabetes mellitus without complications: Secondary | ICD-10-CM | POA: Insufficient documentation

## 2015-08-31 DIAGNOSIS — Z713 Dietary counseling and surveillance: Secondary | ICD-10-CM | POA: Diagnosis not present

## 2015-08-31 NOTE — Progress Notes (Signed)
Diabetes Self-Management Education  Visit Type: First/Initial  Appt. Start Time: 10:35 Appt. End Time: 11:45  08/31/2015  [name], identified by name and date of birth, is a 45 y.o. female with a diagnosis of Diabetes: Type 2.   ASSESSMENT  Height  (1.753 m), last menstrual period 07/17/2015. There is no weight on file to calculate BMI.  Pt states she cut out soda 2 months ago. Pt states she does not have diabetes and does not understand why she is here for diabetes: Dietitian educated pt on A1C and DM diagnosis.      Diabetes Self-Management Education - 08/31/15 1043    Visit Information   Visit Type First/Initial   Initial Visit   Diabetes Type Type 2   Are you currently following a meal plan? No   Are you taking your medications as prescribed? Yes   Date Diagnosed June 2014   Health Coping   How would you rate your overall health? Fair   Psychosocial Assessment   Patient Belief/Attitude about Diabetes Motivated to manage diabetes   Self-care barriers None   Pre-Education Assessment   Patient understands the diabetes disease and treatment process. Needs Instruction   Patient understands incorporating nutritional management into lifestyle. Needs Instruction   Patient undertands incorporating physical activity into lifestyle. Needs Instruction   Patient understands using medications safely. Needs Instruction   Patient understands monitoring blood glucose, interpreting and using results Needs Instruction   Patient understands prevention, detection, and treatment of acute complications. Needs Instruction   Patient understands prevention, detection, and treatment of chronic complications. Needs Instruction   Patient understands how to develop strategies to address psychosocial issues. Needs Instruction   Patient understands how to develop strategies to promote health/change behavior. Needs Instruction   Complications   Last HgB A1C per patient/outside source 7 %    How often do you check your blood sugar? --  once a week   Fasting Blood glucose range (mg/dL) 16-109   Postprandial Blood glucose range (mg/dL) 60-454   Have you had a dilated eye exam in the past 12 months? Yes   Have you had a dental exam in the past 12 months? No   Are you checking your feet? Yes   How many days per week are you checking your feet? 7   Dietary Intake   Breakfast bagel with creme cheese   Lunch none   Dinner meat and vegetable   Beverage(s) sweet tea, water   Exercise   Exercise Type Light (walking / raking leaves)   How many days per week to you exercise? 2   How many minutes per day do you exercise? 45   Total minutes per week of exercise 90   Patient Education   Previous Diabetes Education No   Disease state  Definition of diabetes, type 1 and 2, and the diagnosis of diabetes;Factors that contribute to the development of diabetes   Nutrition management  Role of diet in the treatment of diabetes and the relationship between the three main macronutrients and blood glucose level;Food label reading, portion sizes and measuring food.;Carbohydrate counting;Reviewed blood glucose goals for pre and post meals and how to evaluate the patients' food intake on their blood glucose level.;Information on hints to eating out and maintain blood glucose control.   Physical activity and exercise  Role of exercise on diabetes management, blood pressure control and cardiac health.;Helped patient identify appropriate exercises in relation to his/her diabetes, diabetes complications and other health issue.  Monitoring Taught/evaluated SMBG meter.;Purpose and frequency of SMBG.;Identified appropriate SMBG and/or A1C goals.;Taught/discussed recording of test results and interpretation of SMBG.;Yearly dilated eye exam;Daily foot exams   Acute complications Taught treatment of hypoglycemia - the 15 rule.;Discussed and identified patients' treatment of hyperglycemia.   Psychosocial  adjustment Worked with patient to identify barriers to care and solutions;Identified and addressed patients feelings and concerns about diabetes   Individualized Goals (developed by patient)   Nutrition General guidelines for healthy choices and portions discussed;Adjust meds/carbs with exercise as discussed;Follow meal plan discussed   Physical Activity Exercise 5-7 days per week;15 minutes per day   Monitoring  test blood glucose pre and post meals as discussed   Post-Education Assessment   Patient understands the diabetes disease and treatment process. Demonstrates understanding / competency   Patient understands incorporating nutritional management into lifestyle. Demonstrates understanding / competency   Patient undertands incorporating physical activity into lifestyle. Demonstrates understanding / competency   Patient understands using medications safely. Demonstrates understanding / competency   Patient understands monitoring blood glucose, interpreting and using results Demonstrates understanding / competency   Patient understands prevention, detection, and treatment of acute complications. Demonstrates understanding / competency   Patient understands prevention, detection, and treatment of chronic complications. Demonstrates understanding / competency   Patient understands how to develop strategies to address psychosocial issues. Demonstrates understanding / competency   Patient understands how to develop strategies to promote health/change behavior. Demonstrates understanding / competency   Outcomes   Expected Outcomes Demonstrated interest in learning. Expect positive outcomes   Future DMSE PRN   Program Status Completed      Individualized Plan for Diabetes Self-Management Training:   Learning Objective:  Patient will have a greater understanding of diabetes self-management. Patient education plan is to attend individual and/or group sessions per assessed needs and concerns.    Plan:   There are no Patient Instructions on file for this visit.  Expected Outcomes:  Demonstrated interest in learning. Expect positive outcomes  Education material provided: Living Well with Diabetes, Meal plan card, My Plate and Snack sheet  If problems or questions, patient to contact team via:  Phone  Future DSME appointment: PRN

## 2015-09-06 ENCOUNTER — Other Ambulatory Visit: Payer: Self-pay | Admitting: Family Medicine

## 2015-09-07 ENCOUNTER — Other Ambulatory Visit: Payer: Self-pay | Admitting: Family Medicine

## 2015-10-05 ENCOUNTER — Other Ambulatory Visit: Payer: Self-pay | Admitting: Family Medicine

## 2015-10-05 DIAGNOSIS — Z1231 Encounter for screening mammogram for malignant neoplasm of breast: Secondary | ICD-10-CM

## 2015-10-11 ENCOUNTER — Ambulatory Visit: Payer: 59

## 2015-10-12 ENCOUNTER — Other Ambulatory Visit: Payer: Self-pay | Admitting: Rehabilitation

## 2015-10-12 DIAGNOSIS — M8580 Other specified disorders of bone density and structure, unspecified site: Secondary | ICD-10-CM

## 2015-10-17 ENCOUNTER — Ambulatory Visit: Payer: 59

## 2015-10-24 ENCOUNTER — Ambulatory Visit
Admission: RE | Admit: 2015-10-24 | Discharge: 2015-10-24 | Disposition: A | Discharge status: Home / Self Care | Payer: 59 | Source: Ambulatory Visit | Attending: Family Medicine | Admitting: Family Medicine

## 2015-10-24 ENCOUNTER — Ambulatory Visit
Admission: RE | Admit: 2015-10-24 | Discharge: 2015-10-24 | Disposition: A | Discharge status: Home / Self Care | Payer: 59 | Source: Ambulatory Visit | Attending: Rehabilitation | Admitting: Rehabilitation

## 2015-10-24 DIAGNOSIS — Z1231 Encounter for screening mammogram for malignant neoplasm of breast: Secondary | ICD-10-CM

## 2015-10-24 DIAGNOSIS — M8580 Other specified disorders of bone density and structure, unspecified site: Secondary | ICD-10-CM

## 2015-10-30 ENCOUNTER — Other Ambulatory Visit: Payer: Self-pay | Admitting: Rehabilitation

## 2015-10-30 DIAGNOSIS — M8580 Other specified disorders of bone density and structure, unspecified site: Secondary | ICD-10-CM

## 2015-11-01 ENCOUNTER — Ambulatory Visit: Payer: 59 | Admitting: Family Medicine

## 2015-11-03 ENCOUNTER — Other Ambulatory Visit: Payer: Self-pay | Admitting: Family Medicine

## 2015-11-09 ENCOUNTER — Ambulatory Visit: Payer: 59 | Admitting: Family Medicine

## 2015-11-16 ENCOUNTER — Ambulatory Visit (INDEPENDENT_AMBULATORY_CARE_PROVIDER_SITE_OTHER): Payer: 59 | Admitting: Family Medicine

## 2015-11-16 ENCOUNTER — Encounter: Payer: Self-pay | Admitting: Family Medicine

## 2015-11-16 VITALS — BP 146/74 | HR 86 | Temp 98.3°F | Resp 20 | Ht 69.0 in | Wt 393.5 lb

## 2015-11-16 DIAGNOSIS — R05 Cough: Secondary | ICD-10-CM

## 2015-11-16 DIAGNOSIS — R059 Cough, unspecified: Secondary | ICD-10-CM

## 2015-11-16 DIAGNOSIS — E1140 Type 2 diabetes mellitus with diabetic neuropathy, unspecified: Secondary | ICD-10-CM | POA: Diagnosis not present

## 2015-11-16 DIAGNOSIS — Z6841 Body Mass Index (BMI) 40.0 and over, adult: Secondary | ICD-10-CM | POA: Diagnosis not present

## 2015-11-16 DIAGNOSIS — I10 Essential (primary) hypertension: Secondary | ICD-10-CM

## 2015-11-16 DIAGNOSIS — F418 Other specified anxiety disorders: Secondary | ICD-10-CM

## 2015-11-16 DIAGNOSIS — E785 Hyperlipidemia, unspecified: Secondary | ICD-10-CM

## 2015-11-16 LAB — POCT GLYCOSYLATED HEMOGLOBIN (HGB A1C): Hemoglobin A1C: 7.4

## 2015-11-16 MED ORDER — BENZONATATE 200 MG PO CAPS: 200.0000 mg | ORAL_CAPSULE | Freq: Two times a day (BID) | ORAL | 0 refills | Status: AC | PRN

## 2015-11-16 MED ORDER — HYDROCHLOROTHIAZIDE 50 MG PO TABS: 50.0000 mg | ORAL_TABLET | Freq: Every day | ORAL | 5 refills | Status: DC

## 2015-11-16 MED ORDER — OLMESARTAN MEDOXOMIL 40 MG PO TABS: 40.0000 mg | ORAL_TABLET | Freq: Every day | ORAL | 5 refills | Status: AC

## 2015-11-16 MED ORDER — MONTELUKAST SODIUM 10 MG PO TABS: 10.0000 mg | ORAL_TABLET | Freq: Every day | ORAL | 5 refills | Status: AC

## 2015-11-16 MED ORDER — ATENOLOL 50 MG PO TABS: 50.0000 mg | ORAL_TABLET | Freq: Every day | ORAL | 5 refills | Status: DC

## 2015-11-16 MED ORDER — BUSPIRONE HCL 15 MG PO TABS: 15.0000 mg | ORAL_TABLET | Freq: Every day | ORAL | 5 refills | Status: AC

## 2015-11-16 MED ORDER — SERTRALINE HCL 100 MG PO TABS: 100.0000 mg | ORAL_TABLET | Freq: Every day | ORAL | 5 refills | Status: AC

## 2015-11-16 MED ORDER — AMLODIPINE BESYLATE 10 MG PO TABS: 10.0000 mg | ORAL_TABLET | Freq: Every day | ORAL | 5 refills | Status: AC

## 2015-11-16 MED ORDER — ATORVASTATIN CALCIUM 40 MG PO TABS: ORAL_TABLET | ORAL | 5 refills | Status: DC

## 2015-11-16 MED ORDER — GABAPENTIN 300 MG PO CAPS: 300.0000 mg | ORAL_CAPSULE | Freq: Two times a day (BID) | ORAL | Status: DC

## 2015-11-16 MED ORDER — CYCLOBENZAPRINE HCL 10 MG PO TABS: 10.0000 mg | ORAL_TABLET | Freq: Every day | ORAL | 4 refills | Status: AC

## 2015-11-16 MED ORDER — ONGLYZA 5 MG PO TABS: 5.0000 mg | ORAL_TABLET | Freq: Every day | ORAL | 5 refills | Status: DC

## 2015-11-16 NOTE — Progress Notes (Signed)
Patient ID: Emily Keller, female   DOB: [birth year], 45 y.o.   MRN: [id number]      Patient ID: Emily Keller, female  DOB: 02-03-1971, 46 y.o.   MRN: [id number]  Subjective:  Emily Keller is a 45 y.o. female present for CPE All past medical history, surgical history, allergies, family history, immunizations, medications and social history were updated in the electronic medical record today. All recent labs, ED visits and hospitalizations within the last year were reviewed.  Patient Care Team    Relationship Specialty Notifications Start End  Natalia Leatherwood, DO PCP - General Family Medicine  03/10/15   Letta Kocher, MD  Rehabilitation  08/01/15    Comment: Neurology/spine   Pt presents for follow up on chronic medical conditions. She is moving back to [place] in about a month and is requesting refills on meds to get her through until can establish with new PCP.  Type 2 diabetes mellitus with diabetic neuropathy, without long-term current use of insulin (HCC)/ BMI 50.0-59.9, adult (HCC) Pt reports compliance with onglyza 5 mg daily. Last a1c 7.0 3 months ago. Diabetic eye and foot exam UTD. PNA series started 07/2015. She is prescribed statin and ARB. She denies nonhealing wounds, worsening diabetic neuropathy. She states her fasting BG is about 90-120.   Essential hypertension, benign/Hyperlipidemia LDL goal <100: tolerating liptior 40 mg . Lipid panel UTD. Blood pressure has been normal at other appts this week. She is under more stress today. She reports compliance with medications. She denies chest pain, shortness of breath, Lower ext edema.  Depression with anxiety: she is doing well with her depression and anxiety. She reports compliance with her zoloft and buspar. The idea of moving does have her a little more stressed than usual, but she is managing and is looking forard to be closer to family again.   Cough Pt states her husband brought home a cold last week. She initially had a fever and  sore throat, sinus congestion and runny nose that improved. She denies fever or chills currently. She states that a cough has remained. She is taking mucinex DM.     Depression screen [facility] 2/9 08/31/2015 08/01/2015 03/10/2015  Decreased Interest 0 0 0  Down, Depressed, Hopeless 0 0 0  PHQ - 2 Score 0 0 0    Fall Risk  08/31/2015 08/01/2015 03/10/2015  Falls in the past year? No No No          Past Medical History:  Diagnosis Date  . Allergy   . Anxiety   . Depression   . Diabetes mellitus without complication (HCC)   . Hypertension    Allergies  Allergen Reactions  . Amoxicillin Diarrhea  . Metformin And Related Diarrhea   Past Surgical History:  Procedure Laterality Date  . HYSTEROSCOPY  2013   Family History  Problem Relation Age of Onset  . Diabetes Mother   . Arthritis Father   . Diabetes Father   . Colon cancer Father 59  . Heart disease Maternal Grandmother   . Pancreatic cancer Maternal Grandfather   . Hodgkin's lymphoma Paternal Grandmother   . Heart disease Paternal Grandfather    Social History   Social History  . Marital status: Married    Spouse name: N/A  . Number of children: N/A  . Years of education: N/A   Occupational History  . Not on file.   Social History Main Topics  . Smoking status: Never Smoker  . Smokeless tobacco: Never Used  .  Alcohol use No  . Drug use: No  . Sexual activity: Yes    Birth control/ protection: None   Other Topics Concern  . Not on file   Social History Narrative   Married to [employer] & McLennan[employer] Munar. No children.    College educated. Surgical tech, no longer working.    Drinks caffeinated beverages.   Wears her seatbelt. Wears her bike helmet.    Smoke detector in the home.    Feels safe in her relationships.         ROS: Negative, with the exception of above mentioned in HPI  Objective: BP (!) 146/74 (BP Location: Right Arm, Patient Position: Sitting, Cuff Size: Large)   Pulse 86   Temp 98.3 F (36.8 C)    Resp 20   Ht 5\' 9"  (1.753 m)   Wt (!) 393 lb 8 oz (178.5 kg)   SpO2 97%   BMI 58.11 kg/m  Gen: Afebrile. No acute distress. Nontoxic in appearance, well-developed, well-nourished, female, morbidly obese female. Pleasant.  HENT: AT. [place]. Bilateral TM visualized, WNL. MMM, no oral lesions.Bilateral nares WNL. Throat without erythema, ulcerations or exudates. No Cough on exam, No hoarseness on exam. Eyes:Pupils Equal Round Reactive to light, Extraocular movements intact,  Conjunctiva without redness, discharge or icterus. Neck/lymp/endocrine: Supple/thick, no lymphadenopathy, no thyromegaly CV: RRR no murmur appreciated, No edema, diminished/equal bilaterally DP pulse.  Chest: CTAB, no wheeze, rhonchi or crackles. Abd: Soft. Morbid obesity. NTND. BS present. Skin: no rashes, purpura or petechiae. Warm and well-perfused. Skin intact. Neuro/Msk:  Normal gait. PERLA. EOMi. Alert. Oriented x3.   Psych: Normal affect, dress and demeanor. Normal speech. Normal thought content and judgment.  Assessment/plan: Emily PonderJanet Rockholt is a 45 y.o. female present for annual exam. Type 2 diabetes mellitus with diabetic neuropathy, without long-term current use of insulin (HCC)/BMI<50, Morbid obesity (HCC) - Continue onglyza 5 mg; normal fasting glucose report - HgB A1c 7.0--> 7.4 today - foot exam completed - eye exam UTD - PNA series started 07/2015 with Prevnar 13, due next year - on ARB, statin  - continue onglyza, refills provided for 6 months to cover while moving. Pt encouraged to follow up with diabetes as soon as she settles after her move with new PCP - Patient was encouraged to exercise greater than 150 minutes a week. Patient was encouraged to choose a diet filled with fresh fruits and vegetables, and lean meats. AVS provided to patient today for education/recommendation on gender specific health and safety maintenance.  Hyperlipidemia LDL goal <100 - Continue Lipitor 40 mg - refills for   Depression  with anxiety: - continue Zoloft 100 mg  and Buspar 15 mg - refills for 6 months provided.   Essential hypertension, benign Continue current regimen, 6 months of scripts provided to cover until move.  Mildly elevated today, pt is under more stress. She states it has been normal at home at other appt this week.  - low salt, diet and exercise.  - olmesartan (BENICAR) 20 MG tablet - hydrochlorothiazide (HYDRODIURIL) 50 MG tablet - HCTZ 50 mg daily  Cough: Pt has post viral cough. Discussed cough can remain for a few weeks after virus. Tessalon pearles prescribed, continue mucinex.   [place] moving to [place], no follow up.   > 25 minutes spent with patient, >50% of time spent face to face counseling patient and coordinating care.   Electronically signed by: Felix Pacinienee Yoshito Gaza, DO [facility]

## 2015-11-16 NOTE — Patient Instructions (Signed)
Sorry to see you go.  I hope you move goes smoothly.  I have called in tessalon pearles called into your pharmacy  For your cough.  This looks viral and the cough can last for a few weeks.

## 2015-12-07 ENCOUNTER — Telehealth: Payer: Self-pay | Admitting: *Deleted

## 2015-12-07 ENCOUNTER — Telehealth: Payer: Self-pay | Admitting: Family Medicine

## 2015-12-07 NOTE — Telephone Encounter (Signed)
Patient states her insurance changed and her onglyza is now 229.00. She states she cannot afford this and is requesting a change in her medication.Please advise.

## 2015-12-07 NOTE — Telephone Encounter (Signed)
I will need to know what is on her formulary in that medication class. She could try to ask her pharmacist or have her insurance company fax us her formulary.

## 2015-12-07 NOTE — Telephone Encounter (Signed)
Spoke with patient she will call insurance company to find out what is covered and call us back.

## 2015-12-07 NOTE — Telephone Encounter (Signed)
Spoke with patient situation resolved by pharmacy.

## 2015-12-07 NOTE — Telephone Encounter (Signed)
Patient is requesting a CB. She states her refills are "on hold" at the pharmacy

## 2015-12-08 MED ORDER — LINAGLIPTIN 5 MG PO TABS: 5.0000 mg | ORAL_TABLET | Freq: Every day | ORAL | 5 refills | Status: DC

## 2015-12-08 NOTE — Telephone Encounter (Signed)
I have called in tradjenta 5 mg tab.

## 2015-12-08 NOTE — Telephone Encounter (Signed)
Spoke with patient she states her insurance company will cover januvia ,tradjenta, or alogliptin. She needs this sent in today she took her last onglyza today.

## 2015-12-08 NOTE — Addendum Note (Signed)
Addended by: Felix PaciniKUNEFF, Quanita Barona A on: 12/08/2015 03:29 PM   Modules accepted: Orders

## 2016-02-03 ENCOUNTER — Other Ambulatory Visit: Payer: Self-pay | Admitting: Family Medicine

## 2016-02-03 DIAGNOSIS — I10 Essential (primary) hypertension: Secondary | ICD-10-CM

## 2016-02-08 ENCOUNTER — Telehealth: Payer: Self-pay | Admitting: Family Medicine

## 2016-02-08 NOTE — Telephone Encounter (Signed)
Patient requesting 90 day refill of:  hydrochlorothiazide (HYDRODIURIL) 50 MG tablet olmesartan (BENICAR) 40 MG tablet   [facility] [address] [place].

## 2016-02-08 NOTE — Telephone Encounter (Signed)
Spoke with patient [facility] let her know she needs to let her new pharmacy know and they can get Rx transferred to them. Patient verbalized understanding.

## 2016-02-09 ENCOUNTER — Other Ambulatory Visit: Payer: Self-pay | Admitting: *Deleted

## 2016-02-09 ENCOUNTER — Telehealth: Payer: Self-pay | Admitting: *Deleted

## 2016-02-09 DIAGNOSIS — I10 Essential (primary) hypertension: Secondary | ICD-10-CM

## 2016-02-09 MED ORDER — HYDROCHLOROTHIAZIDE 50 MG PO TABS: 50.0000 mg | ORAL_TABLET | Freq: Every day | ORAL | 3 refills | Status: DC

## 2016-02-09 MED ORDER — HYDROCHLOROTHIAZIDE 50 MG PO TABS: 50.0000 mg | ORAL_TABLET | Freq: Every day | ORAL | 3 refills | Status: AC

## 2016-02-09 NOTE — Telephone Encounter (Signed)
Patient called and states her Benicar is no longer covered by her Insurance since her husband changed jobs and has gotten a Stage managerdifferent insurance. Patient states she is out of her medication. Advised patient to ask pharmacy to dispense emergency amount to get her through until we get prior authorization. This medication is continuation of care and works well for patient.

## 2016-02-15 NOTE — Telephone Encounter (Signed)
Prior authorization request sent by Vernona RiegerLaura CMA

## 2016-03-13 ENCOUNTER — Other Ambulatory Visit: Payer: Self-pay | Admitting: Family Medicine

## 2016-03-14 NOTE — Telephone Encounter (Signed)
Patient requesting refill on Gabapentin.  Medication has never been filled by us, patient last seen 10/2015 for diabetes. Do you want to fill?? Please advise.

## 2016-05-03 ENCOUNTER — Other Ambulatory Visit: Payer: Self-pay | Admitting: *Deleted

## 2016-05-03 MED ORDER — ATENOLOL 50 MG PO TABS: 50.0000 mg | ORAL_TABLET | Freq: Every day | ORAL | 0 refills | Status: AC

## 2016-05-03 MED ORDER — LINAGLIPTIN 5 MG PO TABS: 5.0000 mg | ORAL_TABLET | Freq: Every day | ORAL | 0 refills | Status: AC

## 2016-05-03 MED ORDER — ATORVASTATIN CALCIUM 40 MG PO TABS: ORAL_TABLET | ORAL | 0 refills | Status: AC

## 2016-05-03 NOTE — Telephone Encounter (Signed)
Refill for 30 day supply of atorvastatin.atenolol and tradjenta sent to pharmacy patient needs office visit prior to anymore refills.
